# Patient Record
Sex: Female | Born: 1960 | Race: Black or African American | Hispanic: No | Marital: Married | Smoking: Never smoker
Health system: Southern US, Community
[De-identification: ages and names within clinical notes are randomized; demographics above are authoritative.]

## PROBLEM LIST (undated history)

## (undated) DIAGNOSIS — T7840XA Allergy, unspecified, initial encounter: Secondary | ICD-10-CM

## (undated) HISTORY — DX: Allergy, unspecified, initial encounter: T78.40XA

## (undated) HISTORY — PX: TUBAL LIGATION: SHX77

---

## 1998-11-12 ENCOUNTER — Other Ambulatory Visit: Admission: RE | Admit: 1998-11-12 | Discharge: 1998-11-12 | Payer: Self-pay | Admitting: Obstetrics and Gynecology

## 2001-02-20 ENCOUNTER — Other Ambulatory Visit: Admission: RE | Admit: 2001-02-20 | Discharge: 2001-02-20 | Payer: Self-pay | Admitting: Obstetrics and Gynecology

## 2002-06-04 ENCOUNTER — Other Ambulatory Visit: Admission: RE | Admit: 2002-06-04 | Discharge: 2002-06-04 | Payer: Self-pay | Admitting: Obstetrics and Gynecology

## 2003-06-10 ENCOUNTER — Other Ambulatory Visit: Admission: RE | Admit: 2003-06-10 | Discharge: 2003-06-10 | Payer: Self-pay | Admitting: Obstetrics and Gynecology

## 2004-05-04 ENCOUNTER — Ambulatory Visit (HOSPITAL_COMMUNITY): Admission: RE | Admit: 2004-05-04 | Discharge: 2004-05-04 | Payer: Self-pay | Admitting: Family Medicine

## 2004-05-11 ENCOUNTER — Encounter: Admission: RE | Admit: 2004-05-11 | Discharge: 2004-05-11 | Payer: Self-pay | Admitting: Family Medicine

## 2005-06-02 ENCOUNTER — Encounter: Admission: RE | Admit: 2005-06-02 | Discharge: 2005-06-02 | Payer: Self-pay | Admitting: Obstetrics and Gynecology

## 2005-06-11 ENCOUNTER — Encounter: Admission: RE | Admit: 2005-06-11 | Discharge: 2005-06-11 | Payer: Self-pay | Admitting: Interventional Radiology

## 2005-07-18 ENCOUNTER — Ambulatory Visit (HOSPITAL_COMMUNITY): Admission: RE | Admit: 2005-07-18 | Discharge: 2005-07-18 | Payer: Self-pay | Admitting: Interventional Radiology

## 2005-07-21 ENCOUNTER — Observation Stay (HOSPITAL_COMMUNITY): Admission: RE | Admit: 2005-07-21 | Discharge: 2005-07-22 | Payer: Self-pay | Admitting: Interventional Radiology

## 2005-08-02 ENCOUNTER — Encounter: Admission: RE | Admit: 2005-08-02 | Discharge: 2005-08-02 | Payer: Self-pay | Admitting: Diagnostic Radiology

## 2006-01-19 ENCOUNTER — Encounter: Admission: RE | Admit: 2006-01-19 | Discharge: 2006-01-19 | Payer: Self-pay | Admitting: Interventional Radiology

## 2007-06-16 ENCOUNTER — Emergency Department (HOSPITAL_COMMUNITY): Admission: EM | Admit: 2007-06-16 | Discharge: 2007-06-16 | Payer: Self-pay | Admitting: Emergency Medicine

## 2009-03-25 ENCOUNTER — Encounter: Admission: RE | Admit: 2009-03-25 | Discharge: 2009-03-25 | Payer: Self-pay | Admitting: Sports Medicine

## 2011-10-12 LAB — URINALYSIS, ROUTINE W REFLEX MICROSCOPIC
Bilirubin Urine: NEGATIVE
Glucose, UA: NEGATIVE
Ketones, ur: NEGATIVE
Nitrite: NEGATIVE
Protein, ur: NEGATIVE
Specific Gravity, Urine: 1.002 — ABNORMAL LOW
Urobilinogen, UA: 0.2
pH: 7

## 2011-10-12 LAB — URINE MICROSCOPIC-ADD ON

## 2013-03-27 ENCOUNTER — Other Ambulatory Visit (HOSPITAL_COMMUNITY)
Admission: RE | Admit: 2013-03-27 | Discharge: 2013-03-27 | Disposition: A | Discharge status: Home / Self Care | Payer: Self-pay | Source: Ambulatory Visit | Attending: Medical | Admitting: Medical

## 2013-03-27 ENCOUNTER — Encounter: Payer: Self-pay | Admitting: Medical

## 2013-03-27 ENCOUNTER — Ambulatory Visit (INDEPENDENT_AMBULATORY_CARE_PROVIDER_SITE_OTHER): Payer: Self-pay | Admitting: Medical

## 2013-03-27 VITALS — BP 119/78 | HR 79 | Ht 70.0 in | Wt 222.6 lb

## 2013-03-27 DIAGNOSIS — K5904 Chronic idiopathic constipation: Secondary | ICD-10-CM

## 2013-03-27 DIAGNOSIS — N925 Other specified irregular menstruation: Secondary | ICD-10-CM | POA: Insufficient documentation

## 2013-03-27 DIAGNOSIS — N949 Unspecified condition associated with female genital organs and menstrual cycle: Secondary | ICD-10-CM

## 2013-03-27 DIAGNOSIS — N938 Other specified abnormal uterine and vaginal bleeding: Secondary | ICD-10-CM | POA: Insufficient documentation

## 2013-03-27 DIAGNOSIS — Z01812 Encounter for preprocedural laboratory examination: Secondary | ICD-10-CM

## 2013-03-27 DIAGNOSIS — K5909 Other constipation: Secondary | ICD-10-CM

## 2013-03-27 LAB — CBC
HCT: 32.2 % — ABNORMAL LOW (ref 36.0–46.0)
Hemoglobin: 10.5 g/dL — ABNORMAL LOW (ref 12.0–15.0)
MCH: 27.9 pg (ref 26.0–34.0)
MCHC: 32.6 g/dL (ref 30.0–36.0)
MCV: 85.4 fL (ref 78.0–100.0)
Platelets: 324 10*3/uL (ref 150–400)
RBC: 3.77 MIL/uL — ABNORMAL LOW (ref 3.87–5.11)
RDW: 14.3 % (ref 11.5–15.5)
WBC: 8.1 10*3/uL (ref 4.0–10.5)

## 2013-03-27 LAB — POCT PREGNANCY, URINE: Preg Test, Ur: NEGATIVE

## 2013-03-27 MED ORDER — MEGESTROL ACETATE 40 MG PO TABS: 40.0000 mg | ORAL_TABLET | Freq: Every day | ORAL | Status: AC

## 2013-03-27 NOTE — Patient Instructions (Signed)
Dysfunctional Uterine Bleeding  Normally, menstrual periods begin between ages 11 to 17 in young women. A normal menstrual cycle/period may begin every 23 days up to 35 days and lasts from 1 to 7 days. Around 12 to 14 days before your menstrual period starts, ovulation (ovary produces an egg) occurs. When counting the time between menstrual periods, count from the first day of bleeding of the previous period to the first day of bleeding of the next period.  Dysfunctional (abnormal) uterine bleeding is bleeding that is different from a normal menstrual period. Your periods may come earlier or later than usual. They may be lighter, have blood clots or be heavier. You may have bleeding between periods, or you may skip one period or more. You may have bleeding after sexual intercourse, bleeding after menopause, or no menstrual period.  CAUSES   · Pregnancy (normal, miscarriage, tubal).  · IUDs (intrauterine device, birth control).  · Birth control pills.  · Hormone treatment.  · Menopause.  · Infection of the cervix.  · Blood clotting problems.  · Infection of the inside lining of the uterus.  · Endometriosis, inside lining of the uterus growing in the pelvis and other female organs.  · Adhesions (scar tissue) inside the uterus.  · Obesity or severe weight loss.  · Uterine polyps inside the uterus.  · Cancer of the vagina, cervix, or uterus.  · Ovarian cysts or polycystic ovary syndrome.  · Medical problems (diabetes, thyroid disease).  · Uterine fibroids (noncancerous tumor).  · Problems with your female hormones.  · Endometrial hyperplasia, very thick lining and enlarged cells inside of the uterus.  · Medicines that interfere with ovulation.  · Radiation to the pelvis or abdomen.  · Chemotherapy.  DIAGNOSIS   · Your doctor will discuss the history of your menstrual periods, medicines you are taking, changes in your weight, stress in your life, and any medical problems you may have.  · Your doctor will do a physical  and pelvic examination.  · Your doctor may want to perform certain tests to make a diagnosis, such as:  · Pap test.  · Blood tests.  · Cultures for infection.  · CT scan.  · Ultrasound.  · Hysteroscopy.  · Laparoscopy.  · MRI.  · Hysterosalpingography.  · D and C.  · Endometrial biopsy.  TREATMENT   Treatment will depend on the cause of the dysfunctional uterine bleeding (DUB). Treatment may include:  · Observing your menstrual periods for a couple of months.  · Prescribing medicines for medical problems, including:  · Antibiotics.  · Hormones.  · Birth control pills.  · Removing an IUD (intrauterine device, birth control).  · Surgery:  · D and C (scrape and remove tissue from inside the uterus).  · Laparoscopy (examine inside the abdomen with a lighted tube).  · Uterine ablation (destroy lining of the uterus with electrical current, laser, heat, or freezing).  · Hysteroscopy (examine cervix and uterus with a lighted tube).  · Hysterectomy (remove the uterus).  HOME CARE INSTRUCTIONS   · If medicines were prescribed, take exactly as directed. Do not change or switch medicines without consulting your caregiver.  · Long term heavy bleeding may result in iron deficiency. Your caregiver may have prescribed iron pills. They help replace the iron that your body lost from heavy bleeding. Take exactly as directed.  · Do not take aspirin or medicines that contain aspirin one week before or during your menstrual period. Aspirin may make   the bleeding worse.  · If you need to change your sanitary pad or tampon more than once every 2 hours, stay in bed with your feet elevated and a cold pack on your lower abdomen. Rest as much as possible, until the bleeding stops or slows down.  · Eat well-balanced meals. Eat foods high in iron. Examples are:  · Leafy green vegetables.  · Whole-grain breads and cereals.  · Eggs.  · Meat.  · Liver.  · Do not try to lose weight until the abnormal bleeding has stopped and your blood iron level is  back to normal. Do not lift more than ten pounds or do strenuous activities when you are bleeding.  · For a couple of months, make note on your calendar, marking the start and ending of your period, and the type of bleeding (light, medium, heavy, spotting, clots or missed periods). This is for your caregiver to better evaluate your problem.  SEEK MEDICAL CARE IF:   · You develop nausea (feeling sick to your stomach) and vomiting, dizziness, or diarrhea while you are taking your medicine.  · You are getting lightheaded or weak.  · You have any problems that may be related to the medicine you are taking.  · You develop pain with your DUB.  · You want to remove your IUD.  · You want to stop or change your birth control pills or hormones.  · You have any type of abnormal bleeding mentioned above.  · You are over 16 years old and have not had a menstrual period yet.  · You are 55 years old and you are still having menstrual periods.  · You have any of the symptoms mentioned above.  · You develop a rash.  SEEK IMMEDIATE MEDICAL CARE IF:   · An oral temperature above 102° F (38.9° C) develops.  · You develop chills.  · You are changing your sanitary pad or tampon more than once an hour.  · You develop abdominal pain.  · You pass out or faint.  Document Released: 12/09/2000 Document Revised: 03/05/2012 Document Reviewed: 11/10/2009  [employer]® Patient Information ©2013 [employer].

## 2013-03-27 NOTE — Progress Notes (Signed)
Patient ID: Wanda Harding, female   DOB: [birth year], 52 y.o.   MRN: [id number]  Ms. [name] is a 52 y.o. [phone] who present to clinic today because of dysfunctional uterine bleeding. The patient states that prior to 3 months ago she had regular periods. She has not been using any form of birth control recently. She states that she bled x1 month in February and ~ 3 weeks in March. She has had some fatigue and dizziness, but denies weakness or lightheadedness. The patient has also had some lower back discomfort and feeling of pressure or fullness in the pelvic area since the abnormal bleeding started. She denies recent weight loss, night sweats or malaise.   BP 119/78  Pulse 79  Ht 5\' 10"  (1.778 m)  Wt 222 lb 9.6 oz (100.971 kg)  BMI 31.94 kg/m2  LMP 03/02/2013 GENERAL: Well-developed, well-nourished female in no acute distress.  HEENT: Normocephalic, atraumatic. Sclerae anicteric.  LUNGS: Clear to auscultation bilaterally.  HEART: Regular rate and rhythm. ABDOMEN: Soft, nontender, nondistended. No organomegaly. Some more firm areas of palpation in the suprapubic region.  PELVIC: Normal external female genitalia. Vagina is pink and rugated.  Normal discharge. Normal cervix contour. EXTREMITIES: No cyanosis, clubbing, or edema, 2+ distal pulses.  ENDOMETRIAL BIOPSY     The indications for endometrial biopsy were reviewed.   Risks of the biopsy including cramping, bleeding, infection, uterine perforation, inadequate specimen and need for additional procedures  were discussed. The patient states she understands and agrees to undergo procedure today. Consent was signed. Time out was performed. Urine HCG was negative. A sterile speculum was placed in the patient's vagina and the cervix was prepped with Betadine. A single-toothed tenaculum was placed on the anterior lip of the cervix to stabilize it. The 3 mm pipelle was introduced into the endometrial cavity without difficulty to a depth of 11 cm,  and a moderate amount of tissue was obtained and sent to pathology. The instruments were removed from the patient's vagina. Minimal bleeding from the cervix was noted. The patient tolerated the procedure well. Routine post-procedure instructions were given to the patient.    Biopsy performed by Dr. Erin Fulling  A: DUB  P: CBC and Endometrial biopsy today Patient will be scheduled for [place] prior to next visit to evaluate possible causes of DUB Patient will return to clinic in 2 weeks for results and management  Freddi Starr, PA-C 03/27/2013 3:00 PM

## 2013-03-28 ENCOUNTER — Other Ambulatory Visit: Payer: Self-pay | Admitting: Medical

## 2013-03-28 ENCOUNTER — Telehealth: Payer: Self-pay | Admitting: General Practice

## 2013-03-28 DIAGNOSIS — D649 Anemia, unspecified: Secondary | ICD-10-CM

## 2013-03-28 MED ORDER — FERROUS SULFATE 325 (65 FE) MG PO TABS: 325.0000 mg | ORAL_TABLET | Freq: Every day | ORAL | Status: AC

## 2013-03-28 NOTE — Telephone Encounter (Signed)
Message copied by Kathee Delton on Thu Mar 28, 2013  2:05 PM ------      Message from: Freddi Starr      Created: Thu Mar 28, 2013  1:17 PM       Patient's Hgb is a little low from the bleeding that she has been having. I have sent Rx for iron supplements to her pharmacy. Please advise patient to pick them up at her convenience. Thanks! ------

## 2013-03-28 NOTE — Telephone Encounter (Signed)
Called patient and informed her of results/recommendations and that iron is available for her to pick up at [facility] on [address]. Patient verbalized understanding and had no further questions

## 2013-04-03 ENCOUNTER — Ambulatory Visit (HOSPITAL_COMMUNITY)
Admission: RE | Admit: 2013-04-03 | Discharge: 2013-04-03 | Disposition: A | Discharge status: Home / Self Care | Payer: Self-pay | Source: Ambulatory Visit | Attending: Medical | Admitting: Medical

## 2013-04-03 DIAGNOSIS — N938 Other specified abnormal uterine and vaginal bleeding: Secondary | ICD-10-CM | POA: Insufficient documentation

## 2013-04-03 DIAGNOSIS — N925 Other specified irregular menstruation: Secondary | ICD-10-CM | POA: Insufficient documentation

## 2013-04-03 DIAGNOSIS — N949 Unspecified condition associated with female genital organs and menstrual cycle: Secondary | ICD-10-CM | POA: Insufficient documentation

## 2013-04-03 DIAGNOSIS — D251 Intramural leiomyoma of uterus: Secondary | ICD-10-CM | POA: Insufficient documentation

## 2013-04-12 ENCOUNTER — Ambulatory Visit: Payer: Self-pay | Admitting: Medical

## 2013-04-15 ENCOUNTER — Telehealth: Payer: Self-pay

## 2013-04-15 NOTE — Telephone Encounter (Signed)
Pt called and stated that she wanted April 2nd test results.   Called pt and informed pt of her normal results.  Pt stated understanding and did not have any further questions.

## 2013-04-25 ENCOUNTER — Ambulatory Visit: Payer: Self-pay | Admitting: Obstetrics & Gynecology

## 2013-05-01 ENCOUNTER — Ambulatory Visit: Payer: Self-pay | Admitting: Obstetrics & Gynecology

## 2013-05-17 ENCOUNTER — Telehealth: Payer: Self-pay | Admitting: *Deleted

## 2013-05-17 NOTE — Telephone Encounter (Signed)
Patient left message requesting results and followup information. I called her back and she said she already knew the results and had canceled her followup appt. However she wanted to know what she needed to do now. I told her that the reason for the appt she had cancelled was to determine what to do next for her bleeding. Patient stated that she would call back to reschedule the appt.

## 2013-06-03 ENCOUNTER — Ambulatory Visit (INDEPENDENT_AMBULATORY_CARE_PROVIDER_SITE_OTHER): Payer: Self-pay | Admitting: Medical

## 2013-06-03 ENCOUNTER — Encounter: Payer: Self-pay | Admitting: Medical

## 2013-06-03 VITALS — BP 125/82 | HR 73 | Temp 97.8°F | Ht 70.0 in | Wt 227.6 lb

## 2013-06-03 DIAGNOSIS — N949 Unspecified condition associated with female genital organs and menstrual cycle: Secondary | ICD-10-CM

## 2013-06-03 DIAGNOSIS — N938 Other specified abnormal uterine and vaginal bleeding: Secondary | ICD-10-CM

## 2013-06-03 DIAGNOSIS — N925 Other specified irregular menstruation: Secondary | ICD-10-CM

## 2013-06-03 NOTE — Patient Instructions (Addendum)

## 2013-06-03 NOTE — Progress Notes (Signed)
Patient ID: Wanda Harding, female   DOB: [birth year], 52 y.o.   MRN: [id number]  History:  Wanda Harding is a 52 y.o. [id number] who presents to clinic today for follow-up for DUB. The patient states that she had no period in April and a somewhat prolonged period in May, although this consisted of a few days of spotting as well. She has taken the Iron supplement x 2-3 weeks, but stopped and restarted her Women's MV instead. She denies symptoms of anemia including, weakness, fatigue or dizziness. She states that the provera did help stop her bleeding within 4-5 days after last visit. She wants to discuss options for bleeding control today. She is not interested in the IUD, Depo provera or Provera PO at this time. She does not feel that she would like another ablation in the future either. She has declined a repeat CBC today.    The following portions of the patient's history were reviewed and updated as appropriate: allergies, current medications, past family history, past medical history, past social history, past surgical history and problem list.  Review of Systems:  Pertinent items are noted in HPI.  Objective:  Physical Exam BP 125/82  Pulse 73  Temp(Src) 97.8 F (36.6 C) (Oral)  Ht 5\' 10"  (1.778 m)  Wt 227 lb 9.6 oz (103.239 kg)  BMI 32.66 kg/m2  LMP 05/13/2013 GENERAL: Well-developed, well-nourished female in no acute distress.  HEENT: Normocephalic, atraumatic.  LUNGS: Normal effort HEART: Regular rate  SKIN: Warm, dry and without erythema PSYCH: Normal mood and affect  Labs and Imaging US Pelvis Complete  04/03/2013   *RADIOLOGY REPORT*  Clinical Data: Dysfunctional uterine bleeding.  History of uterine embolization.  LMP 03/02/2013.  TRANSABDOMINAL AND TRANSVAGINAL ULTRASOUND OF PELVIS Technique:  Both transabdominal and transvaginal ultrasound examinations of the pelvis were performed. Transabdominal technique was performed for global imaging of the pelvis including uterus,  ovaries, adnexal regions, and pelvic cul-de-sac.  It was necessary to proceed with endovaginal exam following the transabdominal exam to visualize the endometrium, uterus, and ovaries.  Comparison:  None  Findings:  Uterus: The uterus is anteverted and measures 9.6 x 6.6 x 7.0 cm. In the left lower uterine segment is a focal hypoechoic area measuring 1.1 x 0.6 x 1.2 cm, suggestive of an intramural fibroid.  Endometrium: The endometrium is thickened and heterogeneous, measuring approximately 3.0 cm greatest diameter. The junctional zone between the endometrium and myometrium is indistinct.  Small amount of vascularity is seen within the endometrium.  A discrete focal endometrial mass is not detected.  Right ovary:  Normal appearance/no adnexal mass.  Measures 1.7 x 1.3 x 2.6 cm.  Left ovary: Normal appearance/no adnexal mass.  Measures 4.2 x 3.0 x 2.3 cm and contains a dominant follicle.  Other findings: No free fluid  IMPRESSION:  1.  Thickened, heterogeneous endometrium measures approximately 3 cm greatest diameter. The possibility of an endometrial polyp, submucosal fibroid, endometrial hyperplasia, or endometrial carcinoma cannot be excluded. If bleeding remains unresponsive to hormonal or medical therapy, focal lesion work-up with sonohysterogram should be considered.  Endometrial biopsy should also be considered in pre-menopausal patients at high risk for endometrial carcinoma. (Ref:  Radiological Reasoning: Algorithmic Workup of Abnormal Vaginal Bleeding with Endovaginal Sonography and Sonohysterography. AJR 2008; 696:E95-28) 2.  Probable 1.2 cm intramural fibroid in the left lower uterine segment. 3.  Ovaries within normal limits.   Original Report Authenticated By: Britta Mccreedy, M.D.    Assessment & Plan:  Assessment: Peri-menopause Irregular  menses  Plans: 1. Patient would like to wait and observe bleeding patterns over the next few months before deciding about any intervention 2. Patient may call  for excessive bleeding and Rx for megace can be called in at that time 3. Patient may consider Depo Provera for bleeding control if heavy periods continue to be consistent 4. If patient wishes to return to clinic for discussion of bleeding control. Patient should see MD to discuss possible surgical interventions as she is not interested in most medical options we have discussed today  Freddi Starr, PA-C 06/03/2013 2:54 PM

## 2013-11-02 IMAGING — US US TRANSVAGINAL NON-OB
1 series · 13 of 25 positions shown · non-contrast
Comparison: None

CLINICAL DATA: Dysfunctional uterine bleeding.  History of uterine
embolization.  LMP 03/02/2013.



[Series 1: us pelvis complete · 59 acquisitions, 13 frames shown]
[im 1/59]
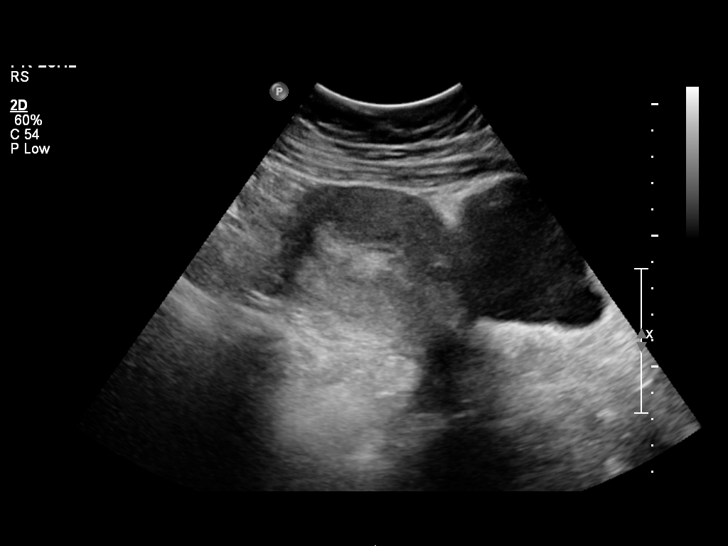
[im 5/59]
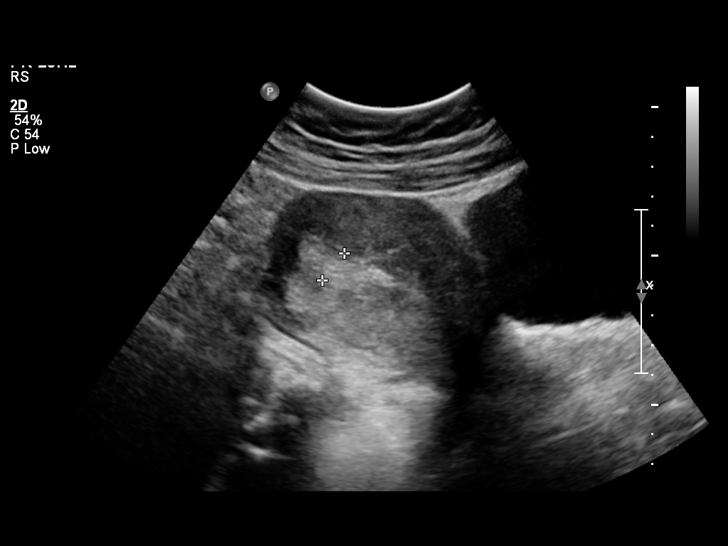
[im 10/59]
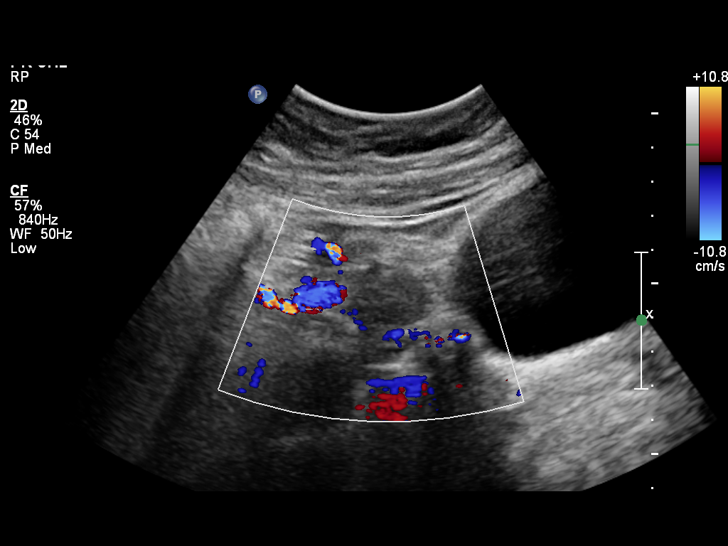
[im 15/59]
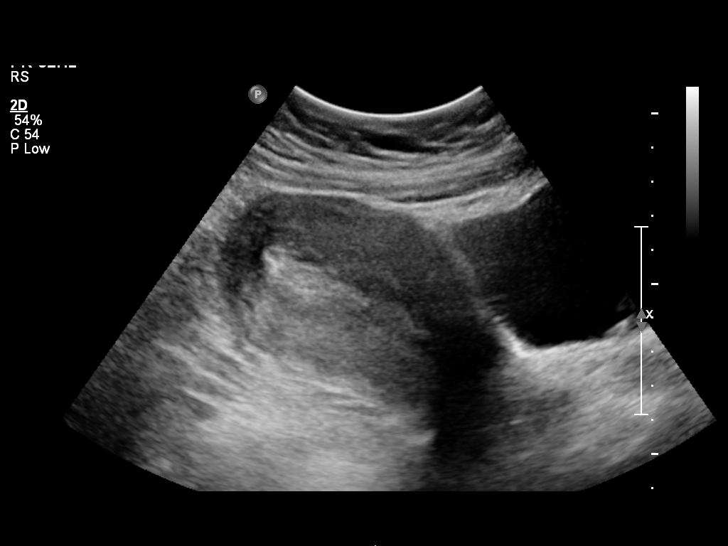
[im 20/59]
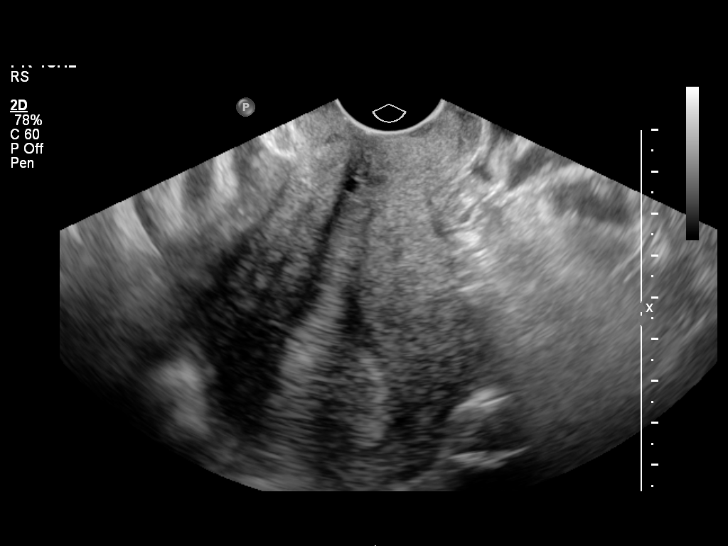
[im 25/59]
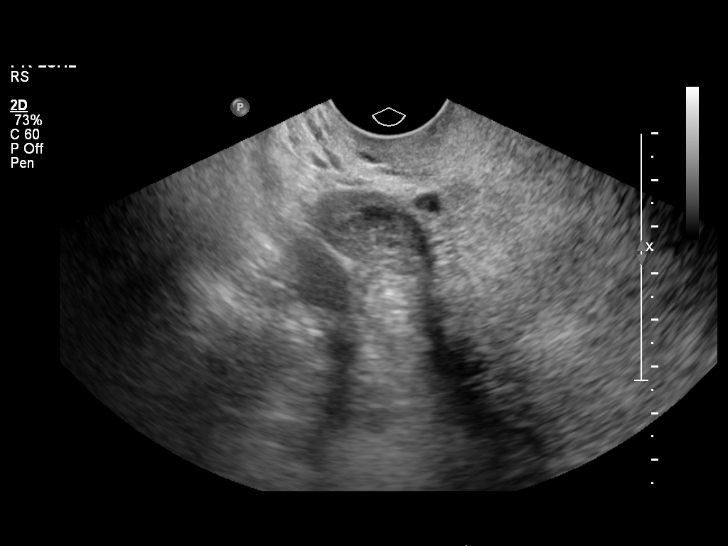
[im 30/59]
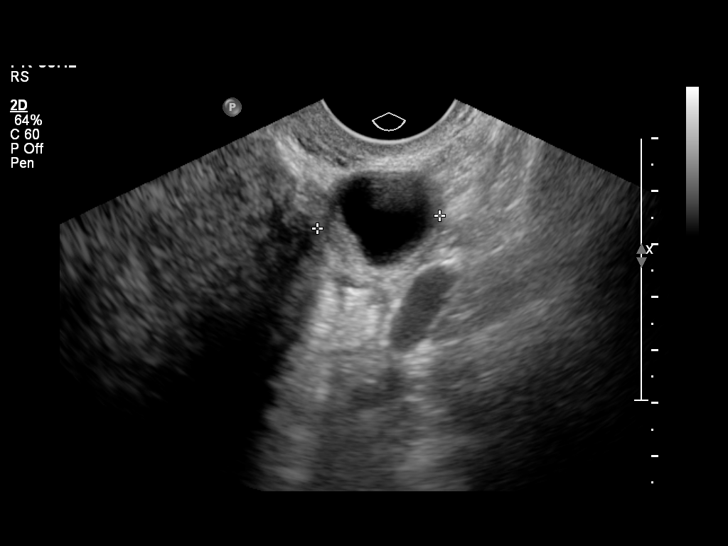
[im 34/59]
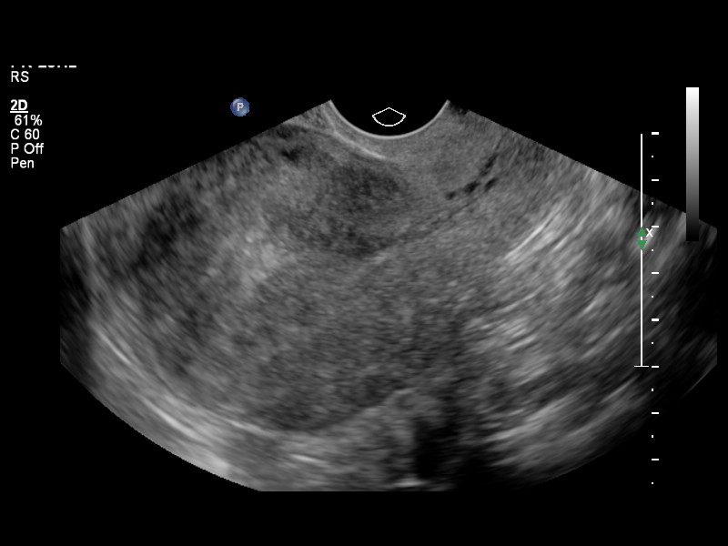
[im 39/59]
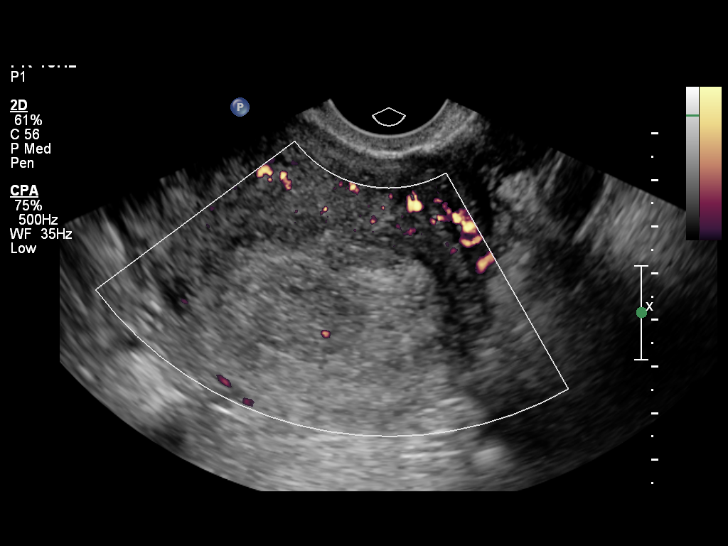
[im 44/59]
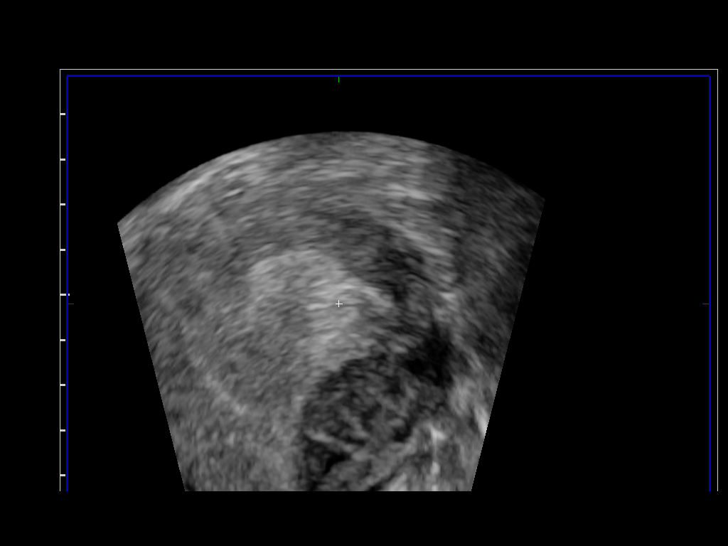
[im 49/59]
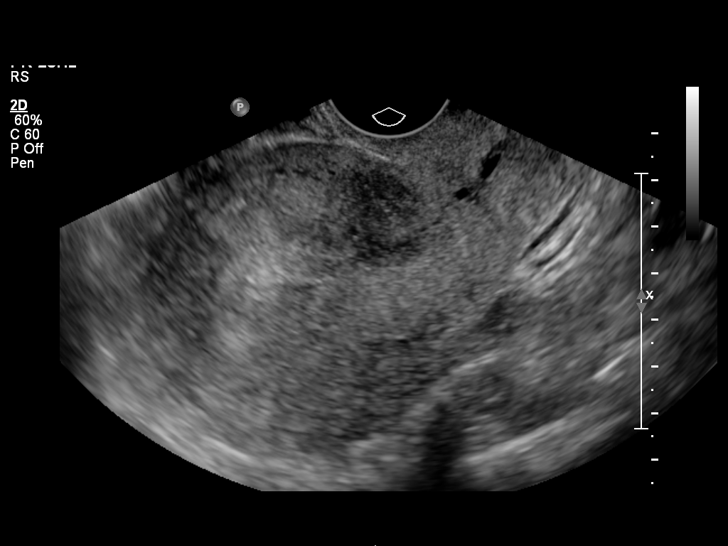
[im 54/59]
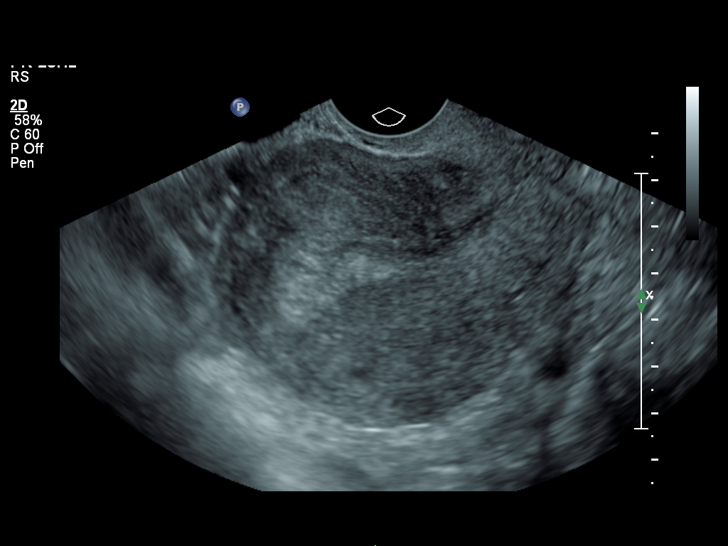
[im 59/59]
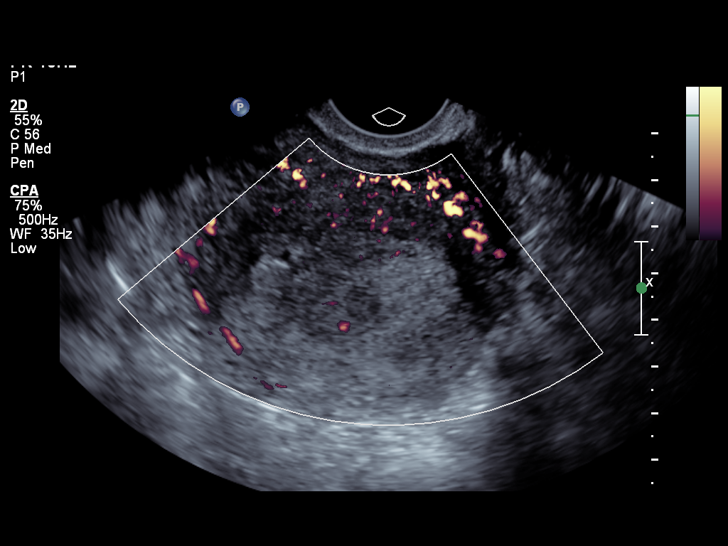

[13 of 25 positions shown; findings below may reference images not displayed]

FINDINGS: Uterus: The uterus is anteverted and measures 9.6 x 6.6 x 7.0 cm.
In the left lower uterine segment is a focal hypoechoic area
measuring 1.1 x 0.6 x 1.2 cm, suggestive of an intramural fibroid.

Endometrium: The endometrium is thickened and heterogeneous,
measuring approximately 3.0 cm greatest diameter. The junctional
zone between the endometrium and myometrium is indistinct.  Small
amount of vascularity is seen within the endometrium.  A discrete
focal endometrial mass is not detected.

Right ovary:  Normal appearance/no adnexal mass.  Measures 1.7 x
1.3 x 2.6 cm.

Left ovary: Normal appearance/no adnexal mass.  Measures 4.2 x
x 2.3 cm and contains a dominant follicle.

Other findings: No free fluid
IMPRESSION: 1.  Thickened, heterogeneous endometrium measures approximately 3
cm greatest diameter. The possibility of an endometrial polyp,
submucosal fibroid, endometrial hyperplasia, or endometrial
carcinoma cannot be excluded. If bleeding remains unresponsive to
hormonal or medical therapy, focal lesion work-up with
sonohysterogram should be considered.  Endometrial biopsy should
also be considered in pre-menopausal patients at high risk for
endometrial carcinoma. (Ref:  Radiological Reasoning: Algorithmic
Workup of Abnormal Vaginal Bleeding with Endovaginal Sonography and
Sonohysterography. AJR 0885; 191:S68-73)
2.  Probable 1.2 cm intramural fibroid in the left lower uterine
segment.
3.  Ovaries within normal limits.

## 2014-10-27 ENCOUNTER — Encounter: Payer: Self-pay | Admitting: Medical

## 2019-01-31 ENCOUNTER — Emergency Department (HOSPITAL_COMMUNITY)
Admission: EM | Admit: 2019-01-31 | Discharge: 2019-02-01 | Disposition: A | Discharge status: Home / Self Care | Payer: BC Managed Care – PPO | Attending: Emergency Medicine | Admitting: Emergency Medicine

## 2019-01-31 ENCOUNTER — Emergency Department (HOSPITAL_COMMUNITY): Payer: BC Managed Care – PPO

## 2019-01-31 ENCOUNTER — Encounter (HOSPITAL_COMMUNITY): Payer: Self-pay | Admitting: Emergency Medicine

## 2019-01-31 DIAGNOSIS — M25521 Pain in right elbow: Secondary | ICD-10-CM | POA: Diagnosis present

## 2019-01-31 DIAGNOSIS — R030 Elevated blood-pressure reading, without diagnosis of hypertension: Secondary | ICD-10-CM | POA: Diagnosis not present

## 2019-01-31 DIAGNOSIS — W19XXXA Unspecified fall, initial encounter: Secondary | ICD-10-CM

## 2019-01-31 DIAGNOSIS — W010XXA Fall on same level from slipping, tripping and stumbling without subsequent striking against object, initial encounter: Secondary | ICD-10-CM | POA: Diagnosis not present

## 2019-01-31 DIAGNOSIS — Z79899 Other long term (current) drug therapy: Secondary | ICD-10-CM | POA: Diagnosis not present

## 2019-01-31 LAB — CBG MONITORING, ED: Glucose-Capillary: 115 mg/dL — ABNORMAL HIGH (ref 70–99)

## 2019-01-31 MED ORDER — ONDANSETRON 4 MG PO TBDP: 4.0000 mg | ORAL_TABLET | Freq: Three times a day (TID) | ORAL | 0 refills | Status: AC | PRN

## 2019-01-31 MED ORDER — HYDROCODONE-ACETAMINOPHEN 5-325 MG PO TABS
2.0000 | ORAL_TABLET | Freq: Once | ORAL | Status: AC
  Administered 2019-01-31: 2 via ORAL
  Filled 2019-01-31: qty 2

## 2019-01-31 MED ORDER — ONDANSETRON 4 MG PO TBDP
4.0000 mg | ORAL_TABLET | Freq: Once | ORAL | Status: AC
  Administered 2019-01-31: 4 mg via ORAL
  Filled 2019-01-31: qty 1

## 2019-01-31 NOTE — ED Provider Notes (Signed)
[place] EMERGENCY DEPARTMENT Provider Note   CSN: [id number] Arrival date & time: 01/31/19  1727     History   Chief Complaint Chief Complaint  Patient presents with  . Fall    HPI Wanda Harding is a 58 y.o. female.  HPI  Patient is a 58 year old female with a history of dysfunctional uterine bleeding presenting for right arm injury.  Patient reports that she was in the kitchen earlier today when her socks night on a nail in the wall.  She reports that this caused her to trip and she slid across the linoleum floor and fell.  She reports that she caught her self while reaching back with her right arm and took the brunt of the fall onto her right elbow.  She reports that she currently has pain from the right humerus down to the right wrist.  Denies numbness or tingling in the arm.  Patient denies loss of consciousness or head injury.  Patient denies any preceding presyncopal symptoms of dizziness, lightheadedness, chest pain or shortness of breath.  Patient does not take blood thinning medications.  Past Medical History:  Diagnosis Date  . Allergy     Patient Active Problem List   Diagnosis Date Noted  . DUB (dysfunctional uterine bleeding) 03/27/2013    Past Surgical History:  Procedure Laterality Date  . TUBAL LIGATION       OB History    Gravida  4   Para  3   Term  3   Preterm      AB  1   Living  3     SAB      TAB      Ectopic      Multiple      Live Births               Home Medications    Prior to Admission medications   Medication Sig Start Date End Date Taking? Authorizing Provider  bisacodyl (DULCOLAX) 5 MG EC tablet Take 5 mg by mouth daily as needed for constipation.    [provider]  ferrous sulfate 325 (65 FE) MG tablet Take 1 tablet (325 mg total) by mouth daily with breakfast. 03/28/13   Luvenia Redden, PA-C  ibuprofen (ADVIL,MOTRIN) 600 MG tablet Take 600 mg by mouth every 6 (six) hours as needed  for pain.    [provider]  megestrol (MEGACE) 40 MG tablet Take 1 tablet (40 mg total) by mouth daily. 03/27/13   Luvenia Redden, PA-C  Multiple Vitamins-Iron (MULTIVITAMIN/IRON PO) Take 1 tablet by mouth daily.    [provider]  phenylephrine-shark liver oil-mineral oil-petrolatum (PREPARATION H) 0.25-3-14-71.9 % rectal ointment Place rectally 2 (two) times daily as needed for hemorrhoids.    [provider]    Family History Family History  Problem Relation Age of Onset  . Cancer Maternal Aunt        breast  . Cancer Maternal Aunt        cervical    Social History Social History   Tobacco Use  . Smoking status: Never Smoker  . Smokeless tobacco: Never Used  Substance Use Topics  . Alcohol use: Yes    Comment: rare, drinks wine 1-2 times per year  . Drug use: No     Allergies   Clindamycin/lincomycin; Darvocet [propoxyphene n-acetaminophen]; Guaifenesin & derivatives; and Sulfa antibiotics   Review of Systems Review of Systems  Musculoskeletal: Positive for arthralgias and myalgias.  Skin: Negative for wound.  Neurological: Negative for weakness and numbness.     Physical Exam Updated Vital Signs BP (!) 171/99 (BP Location: Left Arm)   Pulse 81   Temp 98.6 F (37 C) (Oral)   Resp 17   SpO2 100%   Physical Exam Vitals signs and nursing note reviewed.  Constitutional:      General: She is not in acute distress.    Appearance: She is well-developed. She is not diaphoretic.     Comments: Sitting comfortably in examination chair.   HENT:     Head: Normocephalic and atraumatic.  Eyes:     General:        Right eye: No discharge.        Left eye: No discharge.     Conjunctiva/sclera: Conjunctivae normal.     Comments: EOMs normal to gross examination.  Neck:     Musculoskeletal: Normal range of motion.  Cardiovascular:     Rate and Rhythm: Normal rate and regular rhythm.     Comments: Intact, 2+ radial pulse of right upper  extremity.  Pulmonary:     Comments: Patient converses comfortably without audible wheeze or stridor. Abdominal:     General: There is no distension.  Musculoskeletal:     Comments: Right upper extremity exam: Patient has tenderness to palpation over the right humeral head.  No tenderness palpation of the humeral shaft.  Patient is tenderness posterior aspect of elbow.  Patient has difficulty with flexion, extension, pronation and supination of the right forearm.  No tenderness to palpation of the radial or ulnar shafts.  Patient does have some mild distal radius tenderness to palpation.  No snuffbox tenderness.  Patient has less than 2-second capillary refill distally in the right upper extremity.  Full sensation to light touch distally.  All compartments of the right upper extremity are soft.  Skin:    General: Skin is warm and dry.  Neurological:     Mental Status: She is alert.     Comments: Cranial nerves intact to gross observation. Patient moves extremities without difficulty.  Psychiatric:        Behavior: Behavior normal.        Thought Content: Thought content normal.        Judgment: Judgment normal.      ED Treatments / Results  Labs (all labs ordered are listed, but only abnormal results are displayed) Labs Reviewed - No data to display  EKG None  Radiology No results found.  Procedures Procedures (including critical care time)  Medications Ordered in ED Medications  HYDROcodone-acetaminophen (NORCO/VICODIN) 5-325 MG per tablet 2 tablet (2 tablets Oral Given 01/31/19 1753)     Initial Impression / Assessment and Plan / ED Course  I have reviewed the triage vital signs and the nursing notes.  Pertinent labs & imaging results that were available during my care of the patient were reviewed by me and considered in my medical decision making (see chart for details).  Clinical Course as of Feb 01 2156  Thu Jan 31, 2019  2024 Reassessed.  Patient is having  continued pain with inability to extend the right elbow.  She reports her pain significantly improved after having Norco, however it is made her nauseous.  Discussed risk and benefits of CT including deferring further imaging for outpatient with immobilization versus obtaining CT today.  Patient elected for CT today.  Feel this is reasonable.   [AM]    Clinical Course User Index [AM]  Albesa Seen, PA-C    Patient well-appearing and neurovascularly intact.  Soft compartments, good capillary refill and no paresthesias.  Will obtain radiographs of right humerus, elbow, and wrist to assess for any injuries.  Shoulder, elbow, and wrist radiographs are demonstrating no evidence of acute fracture.  Patient is still having persistent pain in the posterior right elbow.  No effusion seen on x-ray.  No anterior posterior sail sign.  I did discuss with the patient that CT would be recommended if she still has inability to flex and extend the elbow.  Patient was amenable to CT scan, however was experiencing persistent nausea and vomiting from [place].  She reported that she felt unable to go to the CT scanner due to not wanting to change her position.  Discussed risks and benefits of immobilization follow-up with orthopedics versus symptomatic treatment and CT scan.  Patient was not having any nausea or vomiting prior to administration of Norco.  Suspect this is an iatrogenic reaction.  She did not have any presyncopal symptoms prior to her fall today, do not suspect cardiac cause of patient's symptoms.  Patient is directed to closely follow-up with orthopedics, and return if she develops any increasing pain, swelling or pallor or paresthesias of the right upper extremity.  I also recommended that her pain is improving to try to perform range of motion of the right shoulder, as to avoid frozen shoulder.  Patient is in understanding and agrees with plan of care.  Final Clinical Impressions(s) / ED Diagnoses    Final diagnoses:  Fall, initial encounter  Right elbow pain  Elevated blood-pressure reading without diagnosis of hypertension    ED Discharge Orders         Ordered    ondansetron (ZOFRAN-ODT) 4 MG disintegrating tablet  Every 8 hours PRN     01/31/19 2323           Albesa Seen, PA-C 01/31/19 2329    Dorie Rank, MD 02/02/19 [phone]

## 2019-01-31 NOTE — ED Notes (Addendum)
Pt. Refused CT

## 2019-01-31 NOTE — Discharge Instructions (Signed)
Please see the information and instructions below regarding your visit.  Your diagnoses today include:  1. Fall, initial encounter   2. Right elbow pain     Tests performed today include: See side panel of your discharge paperwork for testing performed today. Vital signs are listed at the bottom of these instructions.   The x-rays of your right upper arm, elbow, and wrist do not show acute fracture on x-ray.  Small hairline fractures can be missed however.  If your pain is not improving, you will need further imaging to rule out hairline fracture.  Medications prescribed:    Take any prescribed medications only as prescribed, and any over the counter medications only as directed on the packaging.  I recommend Tylenol, [phone] mg every 6 hours as needed for pain.  Do not exceed 4000 mg in 1 day.  Home care instructions:  Please follow any educational materials contained in this packet.   Please keep your arm in the sling.  You may take it off for bathing.  If your pain is not improving in the next couple days, this is likely a sprain of the elbow.  However, for having persistent pain, there may be a fracture underlying that we will need further imaging.  Please keep the sling on until you are evaluated by orthopedics.  Please take the sling off to perform range of motion of the right shoulder.  This will prevent any frozen shoulder as result of the injury.  Follow-up instructions:  Please follow up with Dr. Griffin Basil as soon as possible for your injury.  Return instructions:  Please return to the Emergency Department if you experience worsening symptoms.  Please return to the emergency department if you develop any increasing pain, swelling, or numbness or tingling or discoloration of the fingers of the right hand. Please return if you have any other emergent concerns.  Additional Information:   Your vital signs today were: BP (!) 156/90    Pulse 76    Temp 98.6 F (37 C) (Oral)     Resp 16    SpO2 100%  If your blood pressure (BP) was elevated on multiple readings during this visit above 130 for the top number or above 80 for the bottom number, please have this repeated by your primary care provider within one month. --------------  Thank you for allowing [place] to participate in your care today.

## 2019-01-31 NOTE — ED Notes (Signed)
Patient transported to X-ray 

## 2019-01-31 NOTE — ED Triage Notes (Signed)
Pt reports tripping and fell in her kitchen, landing on right elbow. Endorses some back pain but mainly in elbow.

## 2019-02-04 ENCOUNTER — Other Ambulatory Visit: Payer: Self-pay | Admitting: Obstetrics and Gynecology

## 2019-02-04 DIAGNOSIS — N6310 Unspecified lump in the right breast, unspecified quadrant: Secondary | ICD-10-CM

## 2019-02-08 ENCOUNTER — Ambulatory Visit
Admission: RE | Admit: 2019-02-08 | Discharge: 2019-02-08 | Disposition: A | Discharge status: Home / Self Care | Payer: BC Managed Care – PPO | Source: Ambulatory Visit | Attending: Obstetrics and Gynecology | Admitting: Obstetrics and Gynecology

## 2019-02-08 DIAGNOSIS — N6310 Unspecified lump in the right breast, unspecified quadrant: Secondary | ICD-10-CM

## 2019-08-14 ENCOUNTER — Other Ambulatory Visit: Payer: Self-pay

## 2019-08-14 DIAGNOSIS — Z20822 Contact with and (suspected) exposure to covid-19: Secondary | ICD-10-CM

## 2019-08-15 LAB — NOVEL CORONAVIRUS, NAA: SARS-CoV-2, NAA: NOT DETECTED

## 2020-03-06 ENCOUNTER — Other Ambulatory Visit: Payer: Self-pay | Admitting: Obstetrics and Gynecology

## 2020-03-06 DIAGNOSIS — N644 Mastodynia: Secondary | ICD-10-CM

## 2020-03-06 DIAGNOSIS — N6310 Unspecified lump in the right breast, unspecified quadrant: Secondary | ICD-10-CM

## 2020-03-25 ENCOUNTER — Ambulatory Visit: Payer: BC Managed Care – PPO

## 2020-03-25 ENCOUNTER — Ambulatory Visit
Admission: RE | Admit: 2020-03-25 | Discharge: 2020-03-25 | Disposition: A | Discharge status: Home / Self Care | Payer: BC Managed Care – PPO | Source: Ambulatory Visit | Attending: Obstetrics and Gynecology | Admitting: Obstetrics and Gynecology

## 2020-03-25 ENCOUNTER — Other Ambulatory Visit: Payer: Self-pay

## 2020-03-25 DIAGNOSIS — N6310 Unspecified lump in the right breast, unspecified quadrant: Secondary | ICD-10-CM

## 2020-03-25 DIAGNOSIS — N644 Mastodynia: Secondary | ICD-10-CM

## 2020-04-09 ENCOUNTER — Ambulatory Visit: Payer: BC Managed Care – PPO | Attending: Family

## 2020-04-09 DIAGNOSIS — Z23 Encounter for immunization: Secondary | ICD-10-CM

## 2020-04-09 NOTE — Progress Notes (Signed)
   Covid-19 Vaccination Clinic  Name:  Wanda Harding    MRN: [id number] DOB: 04-Jul-1961  04/09/2020  [name] was observed post Covid-19 immunization for 15 minutes without incident. She was provided with Vaccine Information Sheet and instruction to access the V-Safe system.   [name] was instructed to call 911 with any severe reactions post vaccine: Marland Kitchen Difficulty breathing  . Swelling of face and throat  . A fast heartbeat  . A bad rash all over body  . Dizziness and weakness   Immunizations Administered    Name Date Dose VIS Date Route   Moderna COVID-19 Vaccine 04/09/2020  4:00 PM 0.5 mL 11/26/2019 Intramuscular   Manufacturer: Moderna   Lot: [id number]   [place][id number]

## 2020-05-05 ENCOUNTER — Ambulatory Visit: Payer: BC Managed Care – PPO

## 2020-05-07 ENCOUNTER — Ambulatory Visit: Payer: BC Managed Care – PPO | Attending: Family

## 2020-05-07 DIAGNOSIS — Z23 Encounter for immunization: Secondary | ICD-10-CM

## 2020-05-07 NOTE — Progress Notes (Signed)
   Covid-19 Vaccination Clinic  Name:  Wanda Harding    MRN: [id number] DOB: 1961-03-03  05/07/2020  [name] was observed post Covid-19 immunization for 15 minutes without incident. She was provided with Vaccine Information Sheet and instruction to access the V-Safe system.   [name] was instructed to call 911 with any severe reactions post vaccine: Marland Kitchen Difficulty breathing  . Swelling of face and throat  . A fast heartbeat  . A bad rash all over body  . Dizziness and weakness   Immunizations Administered    Name Date Dose VIS Date Route   Moderna COVID-19 Vaccine 05/07/2020  4:17 PM 0.5 mL 11/2019 Intramuscular   Manufacturer: Moderna   Lot: [id number]   [place][id number]

## 2021-02-10 ENCOUNTER — Ambulatory Visit: Payer: BC Managed Care – PPO | Admitting: Podiatry

## 2021-02-10 ENCOUNTER — Encounter: Payer: Self-pay | Admitting: Podiatry

## 2021-02-10 ENCOUNTER — Ambulatory Visit (INDEPENDENT_AMBULATORY_CARE_PROVIDER_SITE_OTHER): Payer: BC Managed Care – PPO

## 2021-02-10 ENCOUNTER — Other Ambulatory Visit: Payer: Self-pay

## 2021-02-10 DIAGNOSIS — M79672 Pain in left foot: Secondary | ICD-10-CM

## 2021-02-10 DIAGNOSIS — I10 Essential (primary) hypertension: Secondary | ICD-10-CM | POA: Insufficient documentation

## 2021-02-10 DIAGNOSIS — M7672 Peroneal tendinitis, left leg: Secondary | ICD-10-CM

## 2021-02-10 DIAGNOSIS — E6609 Other obesity due to excess calories: Secondary | ICD-10-CM | POA: Insufficient documentation

## 2021-02-10 MED ORDER — DICLOFENAC SODIUM 75 MG PO TBEC: 75.0000 mg | DELAYED_RELEASE_TABLET | Freq: Two times a day (BID) | ORAL | 2 refills | Status: AC

## 2021-02-11 ENCOUNTER — Other Ambulatory Visit: Payer: Self-pay | Admitting: Podiatry

## 2021-02-11 DIAGNOSIS — M7672 Peroneal tendinitis, left leg: Secondary | ICD-10-CM

## 2021-02-11 NOTE — Progress Notes (Signed)
Subjective:   Patient ID: Wanda Harding, female   DOB: 60 y.o.   MRN: [id number]   HPI Patient presents with a lot of pain in the outside of her left foot does not remember specific injury but states that it is been bothering her for around 6 weeks.  Patient may have hit it but not sure and patient is active at her job moderately obese and does not smoke   Review of Systems  All other systems reviewed and are negative.       Objective:  Physical Exam Vitals and nursing note reviewed.  Constitutional:      Appearance: She is well-developed and well-nourished.  Cardiovascular:     Pulses: Intact distal pulses.  Pulmonary:     Effort: Pulmonary effort is normal.  Musculoskeletal:        General: Normal range of motion.  Skin:    General: Skin is warm.  Neurological:     Mental Status: She is alert.     Neurovascular status found to be intact muscle strength was found to be adequate range of motion adequate.  Patient has quite a bit of discomfort in the outside of the left foot at the peroneal insertion into the base of the fifth metatarsal with inflammation fluid buildup around the area.  No indication of muscle strength loss and it is moderately swollen in the area with patient having good digital perfusion well oriented x3     Assessment:  Acute peroneal tendinitis left with inflammation     Plan:  H&P conditions discussed and x-ray reviewed.  Today I did sterile prep and injected the peroneal insertion base of fifth metatarsal 3 mg dexamethasone Kenalog 5 mg Xylocaine and applied fascial brace to lift up the lateral side of the foot and encouraged active ice therapy.  Placed on diclofenac 75 mg twice daily and discussed possibility for MRI or immobilization if symptoms were to persist.  Reappoint 2 weeks  X-rays indicate there is no signs of stress fracture around the area or arthritic process occurring around this area

## 2021-02-25 ENCOUNTER — Ambulatory Visit: Payer: BC Managed Care – PPO | Admitting: Podiatry

## 2021-03-10 ENCOUNTER — Encounter: Payer: Self-pay | Admitting: Podiatry

## 2021-03-10 ENCOUNTER — Ambulatory Visit: Payer: BC Managed Care – PPO | Admitting: Podiatry

## 2021-03-10 ENCOUNTER — Other Ambulatory Visit: Payer: Self-pay

## 2021-03-10 DIAGNOSIS — M7672 Peroneal tendinitis, left leg: Secondary | ICD-10-CM

## 2021-03-10 DIAGNOSIS — B351 Tinea unguium: Secondary | ICD-10-CM | POA: Diagnosis not present

## 2021-03-13 NOTE — Progress Notes (Signed)
Subjective:   Patient ID: Wanda Harding, female   DOB: 60 y.o.   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  For the peroneal tendinitis continue ice therapy shoe gear modification brace usage and for nail bed I do not recommend current treatment as I do think that this is not fungal in origin and is related to hereditary and recommended that she could paint the nails at this time.  Patient will be seen back to recheck as needed

## 2021-04-21 ENCOUNTER — Ambulatory Visit: Payer: BC Managed Care – PPO | Admitting: Podiatry

## 2021-04-21 ENCOUNTER — Other Ambulatory Visit: Payer: Self-pay

## 2021-04-21 ENCOUNTER — Encounter: Payer: Self-pay | Admitting: Podiatry

## 2021-04-21 ENCOUNTER — Telehealth: Payer: Self-pay | Admitting: *Deleted

## 2021-04-21 DIAGNOSIS — M7672 Peroneal tendinitis, left leg: Secondary | ICD-10-CM

## 2021-04-21 MED ORDER — MELOXICAM 15 MG PO TABS: 15.0000 mg | ORAL_TABLET | Freq: Every day | ORAL | 2 refills | Status: AC

## 2021-04-21 NOTE — Telephone Encounter (Signed)
Patient is having pain and swelling in lower extremity of the left foot since 2 days and is progressively becoming worse. She has been taking ibuprofen, icing and has been out of work for 2 days. Patient is scheduled to come in @3 :45 today.

## 2021-04-23 NOTE — Progress Notes (Signed)
Subjective:   Patient ID: Wanda Harding, female   DOB: 60 y.o.   MRN: [id number]   HPI Patient states she has had a significant reoccurrence of discomfort in the left lateral foot and states its been sore or inflamed and her foot feels unstable.  Does not want further injections as it was only seemed to help for short period   ROS      Objective:  Physical Exam  Neurovascular status intact with discomfort on the lateral side of the left foot around the peroneal base with inflammation fluid at that point     Assessment:  Inflammatory tendinitis of the left lateral foot of peroneal nature that is quite sore and seems to be in response to excessive ambulation or when active     Plan:  H&P reviewed condition recommended long-term boot therapy along with ice and explained that gradual increase in activities and weaning off the boot will be done after 3 weeks.  Reappoint 4 weeks may require MRI or other treatment if symptoms persist

## 2021-05-19 ENCOUNTER — Ambulatory Visit: Payer: BC Managed Care – PPO | Admitting: Podiatry

## 2021-05-31 ENCOUNTER — Other Ambulatory Visit: Payer: Self-pay

## 2021-05-31 ENCOUNTER — Encounter: Payer: Self-pay | Admitting: Podiatry

## 2021-05-31 ENCOUNTER — Ambulatory Visit: Payer: BC Managed Care – PPO | Admitting: Podiatry

## 2021-05-31 DIAGNOSIS — T148XXA Other injury of unspecified body region, initial encounter: Secondary | ICD-10-CM

## 2021-05-31 DIAGNOSIS — M7672 Peroneal tendinitis, left leg: Secondary | ICD-10-CM | POA: Diagnosis not present

## 2021-05-31 MED ORDER — PREDNISONE 10 MG PO TABS: ORAL_TABLET | ORAL | 0 refills | Status: AC

## 2021-05-31 NOTE — Progress Notes (Signed)
Subjective:   Patient ID: Wanda Harding, female   DOB: 60 y.o.   MRN: [id number]   HPI Patient states she has had some improvement within her left outside foot but states it still bothers her at times and she is trying to reduce her boot over time   ROS      Objective:  Physical Exam  Neurovascular status intact negative Bevelyn Buckles' sign noted left peroneal insertion mildly tender localized with no exquisite swelling noted at the current time mild discoloration at the insertion     Assessment:  Appears to be continued inflammatory process with possibility that there still could be involvement of tear of the tendon     Plan:  H&P reviewed condition discussed treatment options and at this point we are can continue topical medicines boot with slow reduction and I put her on a steroid 12-day pack with instructions on usage and reduced activity.  Discussed possible MRI possible other treatment we will see response over the next 6 weeks and make a decision on what will be best

## 2021-07-12 ENCOUNTER — Ambulatory Visit: Payer: BC Managed Care – PPO | Admitting: Podiatry

## 2021-07-23 ENCOUNTER — Telehealth: Payer: Self-pay | Admitting: Podiatry

## 2021-07-23 NOTE — Telephone Encounter (Signed)
Patient calling to get scheduled for MRI. There is no referral for imaging in the chart at this time. Patient stated she was told she needed MRI at last office visit. Please advise.

## 2021-07-26 ENCOUNTER — Other Ambulatory Visit: Payer: Self-pay | Admitting: Podiatry

## 2021-07-26 DIAGNOSIS — M7672 Peroneal tendinitis, left leg: Secondary | ICD-10-CM

## 2021-07-26 DIAGNOSIS — T148XXA Other injury of unspecified body region, initial encounter: Secondary | ICD-10-CM

## 2021-07-26 NOTE — Telephone Encounter (Signed)
I ordered from [facility]. She should hear within 2 weeks

## 2021-08-02 ENCOUNTER — Other Ambulatory Visit: Payer: Self-pay

## 2021-08-02 ENCOUNTER — Ambulatory Visit
Admission: RE | Admit: 2021-08-02 | Discharge: 2021-08-02 | Disposition: A | Discharge status: Home / Self Care | Payer: BC Managed Care – PPO | Source: Ambulatory Visit | Attending: Podiatry | Admitting: Podiatry

## 2021-08-02 DIAGNOSIS — T148XXA Other injury of unspecified body region, initial encounter: Secondary | ICD-10-CM

## 2021-08-04 ENCOUNTER — Telehealth: Payer: Self-pay | Admitting: *Deleted

## 2021-08-04 NOTE — Telephone Encounter (Signed)
Patient is calling to request MRI results. Please advise.

## 2021-08-04 NOTE — Telephone Encounter (Signed)
Results did not indicate pathology. No tear with possibility for inflammation. I will need to see

## 2021-08-10 NOTE — Telephone Encounter (Signed)
Returned the call to patient, gave MRI results per Dr Paulla Dolly, said that she will call back to schedule appointment if further discussions are requested.

## 2021-10-13 ENCOUNTER — Emergency Department (HOSPITAL_COMMUNITY)
Admission: EM | Admit: 2021-10-13 | Discharge: 2021-10-13 | Disposition: A | Discharge status: Home / Self Care | Payer: BC Managed Care – PPO | Attending: Emergency Medicine | Admitting: Emergency Medicine

## 2021-10-13 DIAGNOSIS — H53149 Visual discomfort, unspecified: Secondary | ICD-10-CM | POA: Insufficient documentation

## 2021-10-13 DIAGNOSIS — R112 Nausea with vomiting, unspecified: Secondary | ICD-10-CM | POA: Insufficient documentation

## 2021-10-13 DIAGNOSIS — R52 Pain, unspecified: Secondary | ICD-10-CM

## 2021-10-13 DIAGNOSIS — M791 Myalgia, unspecified site: Secondary | ICD-10-CM | POA: Insufficient documentation

## 2021-10-13 DIAGNOSIS — R519 Headache, unspecified: Secondary | ICD-10-CM | POA: Diagnosis present

## 2021-10-13 DIAGNOSIS — Z20822 Contact with and (suspected) exposure to covid-19: Secondary | ICD-10-CM | POA: Diagnosis not present

## 2021-10-13 LAB — RESP PANEL BY RT-PCR (FLU A&B, COVID) ARPGX2
Influenza A by PCR: NEGATIVE
Influenza B by PCR: NEGATIVE
SARS Coronavirus 2 by RT PCR: NEGATIVE

## 2021-10-13 MED ORDER — DIPHENHYDRAMINE HCL 25 MG PO CAPS
25.0000 mg | ORAL_CAPSULE | Freq: Once | ORAL | Status: AC
  Administered 2021-10-13: 25 mg via ORAL
  Filled 2021-10-13: qty 1

## 2021-10-13 MED ORDER — CYCLOBENZAPRINE HCL 10 MG PO TABS
10.0000 mg | ORAL_TABLET | Freq: Once | ORAL | Status: AC
  Administered 2021-10-13: 10 mg via ORAL
  Filled 2021-10-13: qty 1

## 2021-10-13 MED ORDER — ONDANSETRON HCL 4 MG/2ML IJ SOLN
4.0000 mg | Freq: Once | INTRAMUSCULAR | Status: DC
  Filled 2021-10-13: qty 2

## 2021-10-13 MED ORDER — CYCLOBENZAPRINE HCL 10 MG PO TABS: 10.0000 mg | ORAL_TABLET | Freq: Two times a day (BID) | ORAL | 0 refills | Status: AC | PRN

## 2021-10-13 MED ORDER — PROCHLORPERAZINE MALEATE 5 MG PO TABS
10.0000 mg | ORAL_TABLET | Freq: Once | ORAL | Status: AC
  Administered 2021-10-13: 10 mg via ORAL
  Filled 2021-10-13: qty 2

## 2021-10-13 MED ORDER — DIPHENHYDRAMINE HCL 50 MG/ML IJ SOLN
12.5000 mg | Freq: Once | INTRAMUSCULAR | Status: DC
Start: 2021-10-13 — End: 2021-10-13
  Filled 2021-10-13: qty 1

## 2021-10-13 MED ORDER — SODIUM CHLORIDE 0.9 % IV BOLUS: 500.0000 mL | Freq: Once | INTRAVENOUS | Status: DC

## 2021-10-13 MED ORDER — ONDANSETRON 8 MG PO TBDP
8.0000 mg | ORAL_TABLET | Freq: Once | ORAL | Status: AC
  Administered 2021-10-13: 8 mg via ORAL
  Filled 2021-10-13: qty 1

## 2021-10-13 MED ORDER — METOCLOPRAMIDE HCL 5 MG/ML IJ SOLN
10.0000 mg | Freq: Once | INTRAMUSCULAR | Status: DC
  Filled 2021-10-13: qty 2

## 2021-10-13 MED ORDER — ONDANSETRON HCL 4 MG PO TABS: 4.0000 mg | ORAL_TABLET | Freq: Three times a day (TID) | ORAL | 0 refills | Status: AC | PRN

## 2021-10-13 NOTE — ED Provider Notes (Signed)
[facility] EMERGENCY DEPARTMENT Provider Note   CSN: [id number] Arrival date & time: 10/13/21  1153     History Chief Complaint  Patient presents with   Migraine    Wanda Harding is a 60 y.o. female.  HPI  Patient with no significant medical history presents to the emergency department with chief complaint of a headache and nausea and vomiting.  Patient states this started today, states that the headache began this morning, came on gradually has gotten worse, states headache is mainly in the front and on top of her head, has associated photophobia, increased sensitivity to noise, denies change in vision, paresthesias or weakness of the upper lower extremities, she denies neck pain, associated fevers, chills, chest pain, shortness of breath, peripheral edema, no history of IV drug use, she denies recent falls, is not on anticoagulant.  She states that she has had headaches like this in the past, not the worst headache of her life.  She states that she is taking some ibuprofen without much relief.  Patient has no other complaints this time, she does not endorse fevers, chills, chest pain, shortness of breath, worsening pedal edema.  Past Medical History:  Diagnosis Date   Allergy     Patient Active Problem List   Diagnosis Date Noted   Benign essential hypertension 02/10/2021   Obesity due to excess calories 02/10/2021   DUB (dysfunctional uterine bleeding) 03/27/2013    Past Surgical History:  Procedure Laterality Date   TUBAL LIGATION       OB History     Gravida  4   Para  3   Term  3   Preterm      AB  1   Living  3      SAB      IAB      Ectopic      Multiple      Live Births              Family History  Problem Relation Age of Onset   Cancer Maternal Aunt        breast   Breast cancer Maternal Aunt    Cancer Maternal Aunt        cervical   Breast cancer Sister     Social History   Tobacco Use   Smoking status: Never   Smokeless  tobacco: Never  Substance Use Topics   Alcohol use: Yes    Comment: rare, drinks wine 1-2 times per year   Drug use: No    Home Medications Prior to Admission medications   Medication Sig Start Date End Date Taking? Authorizing Provider  cyclobenzaprine (FLEXERIL) 10 MG tablet Take 1 tablet (10 mg total) by mouth 2 (two) times daily as needed for muscle spasms. 10/13/21  Yes Marcello Fennel, PA-C  hydrocortisone cream 1 % Apply 1 application topically 2 (two) times daily.   Yes [provider]  ibuprofen (ADVIL,MOTRIN) 600 MG tablet Take 600 mg by mouth every 6 (six) hours as needed for pain.   Yes [provider]  ondansetron (ZOFRAN) 4 MG tablet Take 1 tablet (4 mg total) by mouth every 8 (eight) hours as needed for nausea or vomiting. 10/13/21  Yes Marcello Fennel, PA-C  bisacodyl (DULCOLAX) 5 MG EC tablet Take 5 mg by mouth daily as needed for constipation. Patient not taking: No sig reported    [provider]  diclofenac (VOLTAREN) 75 MG EC tablet Take 1 tablet (75 mg  total) by mouth 2 (two) times daily. Patient not taking: No sig reported 02/10/21   Wallene Huh, DPM  ferrous sulfate 325 (65 FE) MG tablet Take 1 tablet (325 mg total) by mouth daily with breakfast. Patient not taking: No sig reported 03/28/13   Luvenia Redden, PA-C  LORazepam (ATIVAN) 0.5 MG tablet Take 0.5 mg by mouth 2 (two) times daily as needed. Patient not taking: Reported on 10/13/2021 09/03/21   [provider]  megestrol (MEGACE) 40 MG tablet Take 1 tablet (40 mg total) by mouth daily. Patient not taking: No sig reported 03/27/13   Luvenia Redden, PA-C  meloxicam (MOBIC) 15 MG tablet Take 1 tablet (15 mg total) by mouth daily. Patient not taking: No sig reported 04/21/21   Wallene Huh, DPM  Multiple Vitamins-Iron (MULTIVITAMIN/IRON PO) Take 1 tablet by mouth daily. Patient not taking: No sig reported    [provider]  ondansetron (ZOFRAN-ODT) 4 MG  disintegrating tablet Take 1 tablet (4 mg total) by mouth every 8 (eight) hours as needed for nausea or vomiting. Patient not taking: No sig reported 01/31/19   Langston Masker B, PA-C  phenylephrine-shark liver oil-mineral oil-petrolatum (PREPARATION H) 0.25-3-14-71.9 % rectal ointment Place rectally 2 (two) times daily as needed for hemorrhoids. Patient not taking: No sig reported    [provider]  predniSONE (DELTASONE) 10 MG tablet 12 day tapering dose Patient not taking: No sig reported 05/31/21   Wallene Huh, DPM    Allergies    Aspirin, Clindamycin/lincomycin, Darvocet [propoxyphene n-acetaminophen], Guaifenesin & derivatives, Sulfa antibiotics, and Hydrocodone  Review of Systems   Review of Systems  Constitutional:  Negative for chills and fever.  HENT:  Negative for congestion.   Eyes:  Positive for photophobia.  Respiratory:  Negative for shortness of breath.   Cardiovascular:  Negative for chest pain.  Gastrointestinal:  Positive for nausea and vomiting. Negative for abdominal pain.  Genitourinary:  Negative for enuresis.  Musculoskeletal:  Negative for back pain.  Skin:  Negative for rash.  Neurological:  Positive for headaches. Negative for dizziness.  Hematological:  Does not bruise/bleed easily.   Physical Exam Updated Vital Signs BP (!) 154/84   Pulse 66   Temp 97.9 F (36.6 C) (Oral)   Resp 15   Ht 5\' 10"  (1.778 m)   Wt 116.1 kg   SpO2 97%   BMI 36.73 kg/m   Physical Exam Vitals and nursing note reviewed.  Constitutional:      General: She is not in acute distress.    Appearance: She is not ill-appearing.  HENT:     Head: Normocephalic and atraumatic.     Nose: No congestion.     Mouth/Throat:     Mouth: Mucous membranes are moist.     Pharynx: Oropharynx is clear.  Eyes:     Conjunctiva/sclera: Conjunctivae normal.  Cardiovascular:     Rate and Rhythm: Normal rate and regular rhythm.     Pulses: Normal pulses.     Heart sounds: No  murmur heard.   No friction rub. No gallop.  Pulmonary:     Effort: No respiratory distress.     Breath sounds: No wheezing, rhonchi or rales.  Musculoskeletal:     Right lower leg: No edema.     Left lower leg: No edema.     Comments: Patient has full range of motion, 5 of 5 strength, neuro vas intact the upper lower extremities, spine was palpated was nontender to  palpation.  Skin:    General: Skin is warm and dry.  Neurological:     Mental Status: She is alert.     GCS: GCS eye subscore is 4. GCS verbal subscore is 5. GCS motor subscore is 6.     Cranial Nerves: No cranial nerve deficit or facial asymmetry.     Sensory: Sensation is intact.     Motor: No weakness.     Coordination: Romberg sign negative. Finger-Nose-Finger Test normal.     Comments: Cranial nerves II through XII are grossly intact, no difficult word finding, no slurring of words, able to follow two-step commands, no unilateral weakness present.  Psychiatric:        Mood and Affect: Mood normal.    ED Results / Procedures / Treatments   Labs (all labs ordered are listed, but only abnormal results are displayed) Labs Reviewed  RESP PANEL BY RT-PCR (FLU A&B, COVID) [id number]    EKG EKG Interpretation  Date/Time:  Wednesday October 13 2021 17:02:43 EDT Ventricular Rate:  65 PR Interval:  158 QRS Duration: 95 QT Interval:  487 QTC Calculation: 507 R Axis:   79 Text Interpretation: Sinus rhythm Nonspecific T abnrm, anterolateral leads Prolonged QT interval ECG OTHERWISE WITHIN NORMAL LIMITS Confirmed by Noemi Chapel [phone]) on 10/13/2021 5:33:49 PM  Radiology No results found.  Procedures Procedures   Medications Ordered in ED Medications  prochlorperazine (COMPAZINE) tablet 10 mg (10 mg Oral Given 10/13/21 1557)  diphenhydrAMINE (BENADRYL) capsule 25 mg (25 mg Oral Given 10/13/21 1557)  ondansetron (ZOFRAN-ODT) disintegrating tablet 8 mg (8 mg Oral Given 10/13/21 1555)  cyclobenzaprine (FLEXERIL)  tablet 10 mg (10 mg Oral Given 10/13/21 1703)    ED Course  I have reviewed the triage vital signs and the nursing notes.  Pertinent labs & imaging results that were available during my care of the patient were reviewed by me and considered in my medical decision making (see chart for details).    MDM Rules/Calculators/A&P                          Initial impression-patient presents with a headache nausea and vomiting.  She is alert, does not appear acute stress, vital signs reassuring, history consistent with tension headache/migraine will provide migraine cocktail and reassess.  Work-up-EKG sinus without signs of ischemia, respiratory panel negative.  Reassessment- nursing staff notified me patient is refusing IV at this time, she requests p.o. medication instead, will change orders.  Patient  reassessed, states she is feeling better, headache has improved, she is not nauseous at this time, tolerating p.o., she does endorse she now has generalized body aches, and feels as if her breathing was shallow, thinks it is from the nasal congestion, she has had a recent sick contact, she is vaccinated to COVID-19, likely she is suffering from a viral URI, will add on respiratory panel, obtain EKG and reassess.  Patient reassessed, has no complaints this time, patient agreed for discharge.  Rule out-I have low suspicion for intracranial head bleed or CVA she has no focal deficits present on exam, she denies recent head trauma, is not on anticoagulant, presentation is atypical of etiology.  CT imaging was deferred as she states this feels like her typical headaches, improvement after migraine cocktail,.  No new features.  Low suspicion for dissection of the vertebral/carotid arteries as patient has low risk factors, presentation atypical.  Low suspicion for meningitis as she has no meningeal sign, she  is nontoxic-appearing, no history of IV drug use.  I have low suspicion for ACS as patient has chest  pain, no shortness of breath, EKG sinus without signs of ischemia.  She does endorse some shallow breathing but she also endorses general body aches nasal congestion had a recent sick contact this is likely secondary due to a viral process.  Low suspicion for PE as vital signs are reassuring, nontachypneic, nonhypoxic, no unilateral edema present.  I have low suspicion for pneumonia as there is no rhonchi rales heard my exam, imaging will be deferred as is atypical to develop pneumonia on first day of onset.  Plan-  Headache, general body aches-likely patient  has a viral process, will recommend that she continue with over-the-counter pain medications, will provide her with antiemetics, Flexeril for general body aches follow-up with PCP as needed.  Given strict return precautions.  Vital signs have remained stable, no indication for hospital admission.   Patient given at home care as well strict return precautions.  Patient verbalized that they understood agreed to said plan.  Final Clinical Impression(s) / ED Diagnoses Final diagnoses:  Bad headache  Body aches    Rx / DC Orders ED Discharge Orders          Ordered    cyclobenzaprine (FLEXERIL) 10 MG tablet  2 times daily PRN        10/13/21 1829    ondansetron (ZOFRAN) 4 MG tablet  Every 8 hours PRN        10/13/21 1829             Marcello Fennel, PA-C 10/13/21 1831    Noemi Chapel, MD 10/14/21 2328

## 2021-10-13 NOTE — Discharge Instructions (Addendum)
Exam and lab work are all reassuring, your COVID test and flu test were negative.  Please continue with over-the-counter pain medications ibuprofen and Tylenol as needed for fever and pain control.  Also given you Flexeril please use as needed for muscle spasms.  Zofran for nausea.  Please stay hydrated and drink plenty fluids.  Follow-up with your PCP as needed.  Come back to the emergency department if you develop chest pain, shortness of breath, severe abdominal pain, uncontrolled nausea, vomiting, diarrhea.

## 2021-10-13 NOTE — ED Notes (Addendum)
Pt. Refusing to have an IV placed. EMS attempted and this nurse attempted x1. Pt. Is requesting oral medications instead.

## 2021-10-13 NOTE — ED Notes (Signed)
Encouraged pt. To drink their water as tolerated. Pt. Keeps complaining of leg cramps. Notified PA.

## 2021-10-13 NOTE — ED Triage Notes (Signed)
Pt. Arrived via [facility] with complaints of feeling suddenly weak and having a headache during a meeting. Pt. Had x1 emesis at their work. Pt. Has had one more episode of emesis coming into the room. Pt. Has a history of migranes. Pt. States they have been feeling weak for x 2 days.

## 2022-03-03 IMAGING — MR MR ANKLE*L* W/O CM
5 series · 40 of 40 positions shown · non-contrast
Comparison: None.

CLINICAL DATA: Left ankle pain along the lateral aspect for 4
months. No known injury.

EXAM:
MRI OF THE LEFT ANKLE WITHOUT CONTRAST
TECHNIQUE: Multiplanar, multisequence MR imaging of the ankle was performed. No
intravenous contrast was administered.

[Series 4: T1 · sagittal · 4.0mm · 0.56mm/px · 6 of 20 slices shown]
[im 1/20]
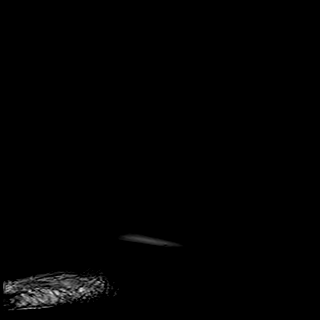
[im 4/20]
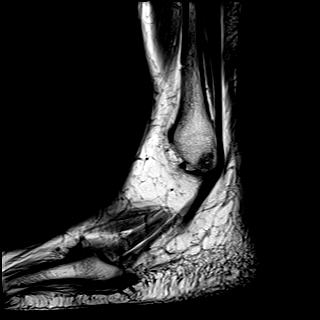
[im 8/20]
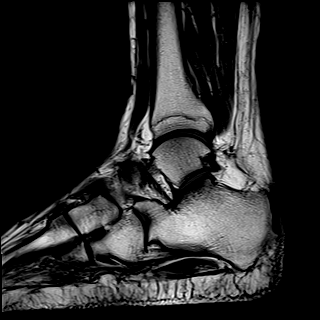
[im 12/20]
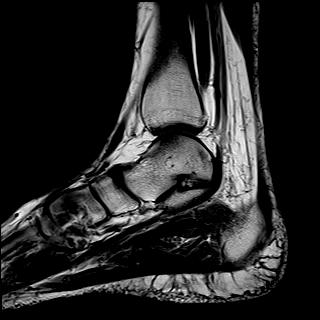
[im 16/20]
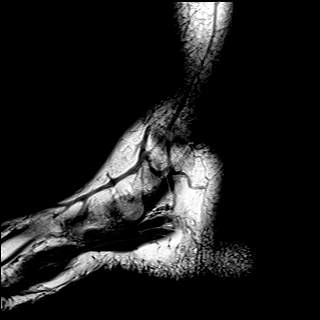
[im 20/20]
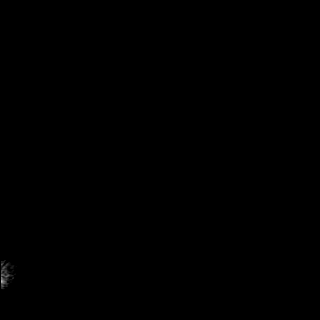

[Series 5: T2 fat-sat · axial · 3.0mm · 0.50mm/px · z∈[-65,+63]mm · 9 of 34 slices shown (1 of 2)]
[im 1/34]
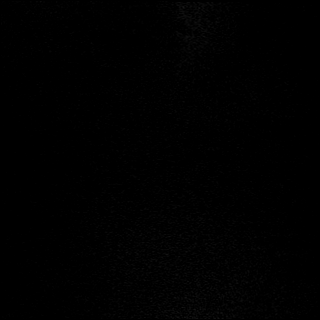
[im 5/34]
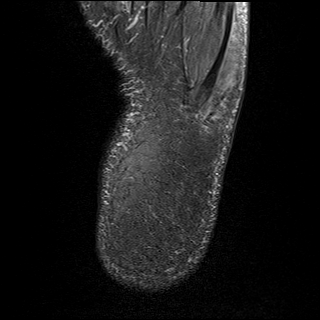
[im 9/34]
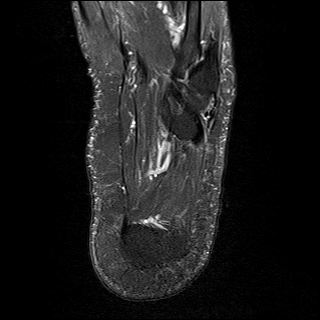
[im 13/34]
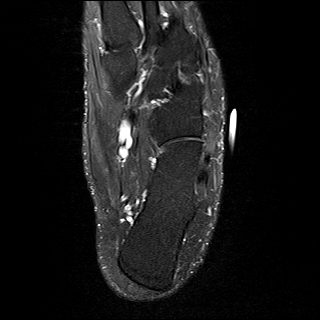
[im 17/34]
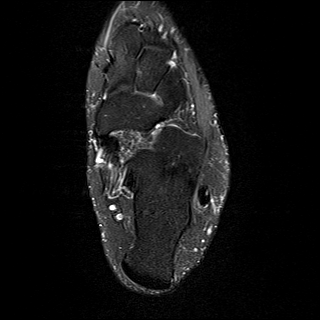
[im 21/34]
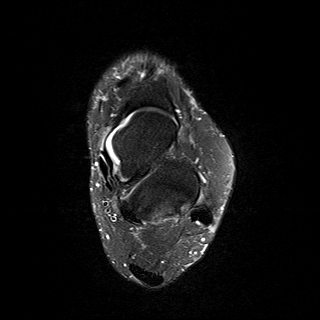
[im 25/34]
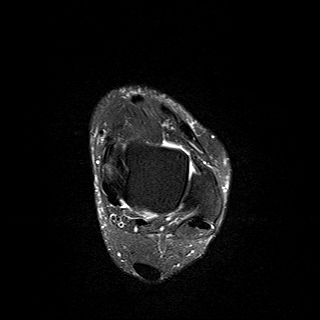
[im 29/34]
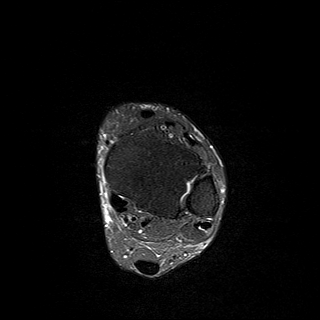
[im 34/34]
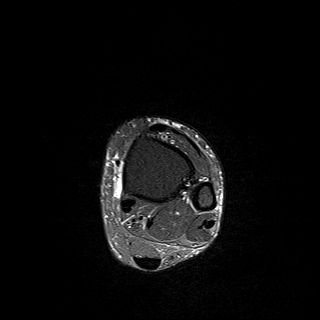

[Series 6: STIR · sagittal · 4.0mm · 0.35mm/px · 5 of 20 slices shown]
[im 1/20]
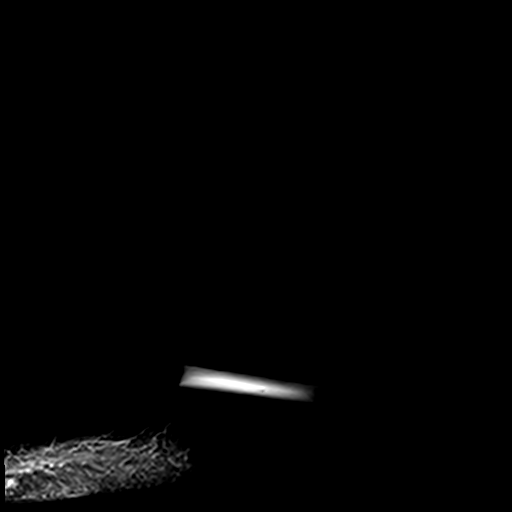
[im 5/20]
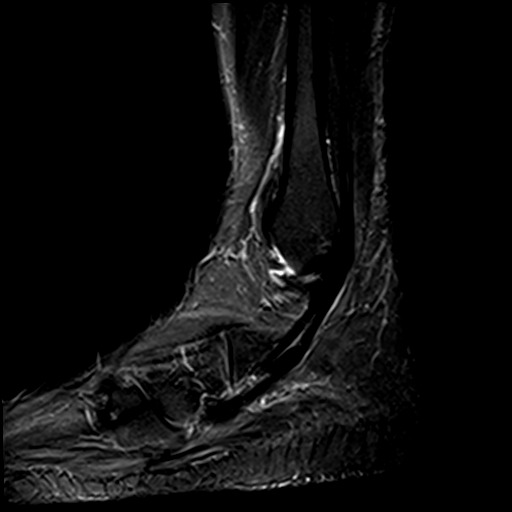
[im 10/20]
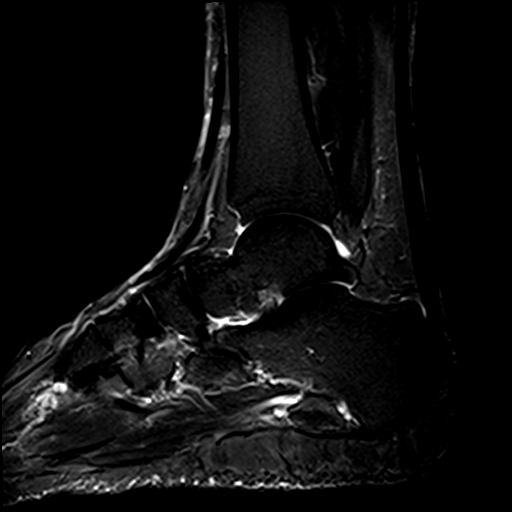
[im 15/20]
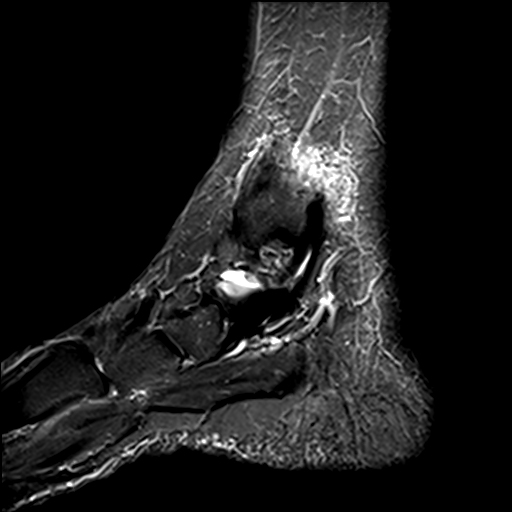
[im 20/20]
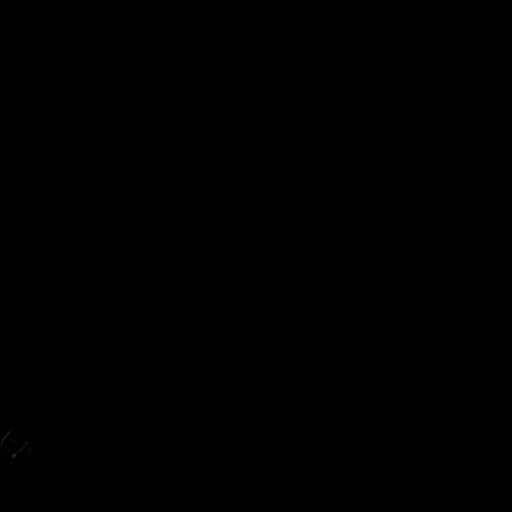

[Series 7: PD fat-sat · axial · 3.0mm · 0.50mm/px · z∈[-65,+63]mm · 9 of 34 slices shown]
[im 1/34]
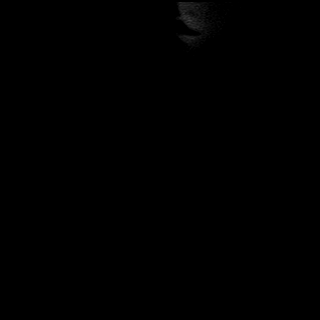
[im 5/34]
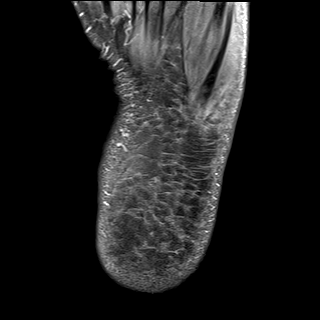
[im 9/34]
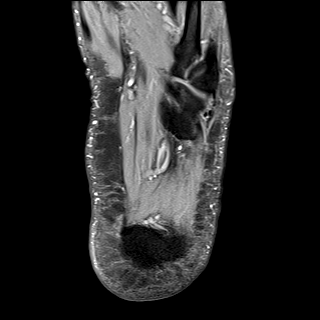
[im 13/34]
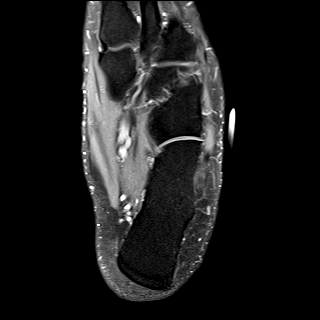
[im 17/34]
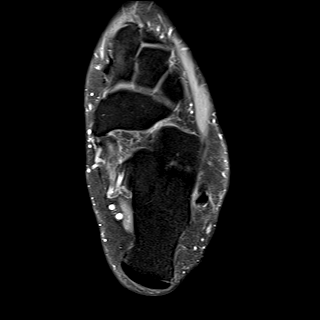
[im 21/34]
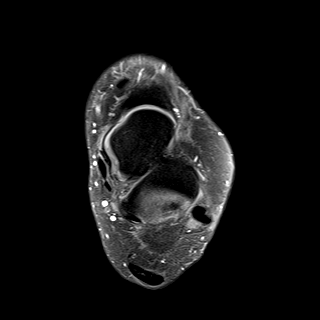
[im 25/34]
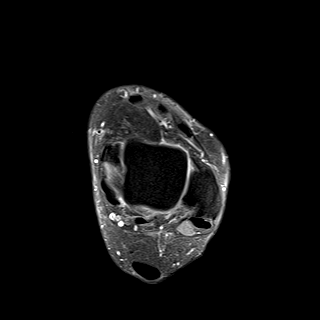
[im 29/34]
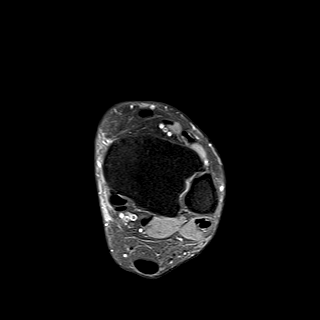
[im 34/34]
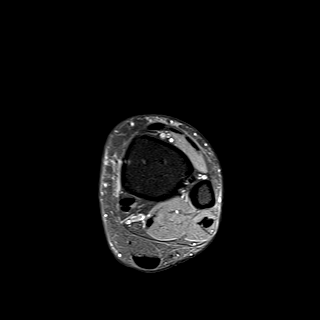

[Series 8: T2 fat-sat · coronal · 3.0mm · 0.50mm/px · 11 of 39 slices shown (2 of 2)]
[im 1/39]
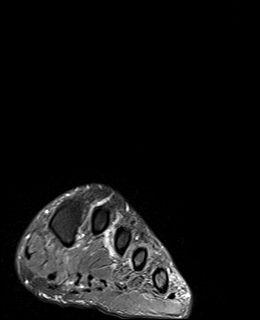
[im 4/39]
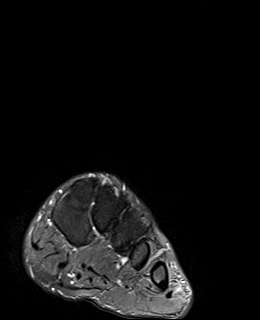
[im 8/39]
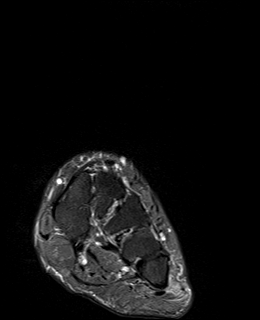
[im 12/39]
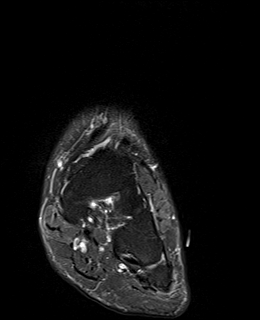
[im 16/39]
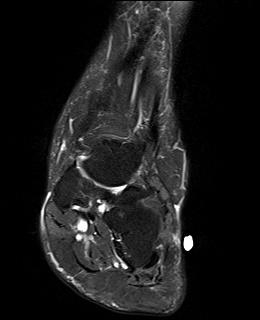
[im 20/39]
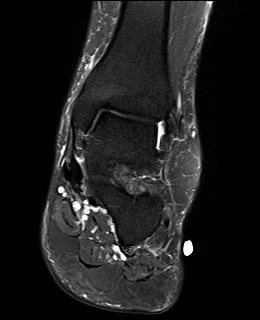
[im 23/39]
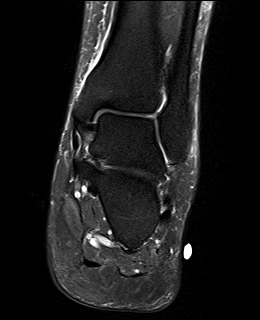
[im 27/39]
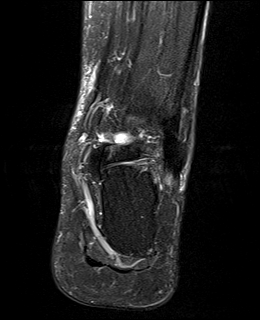
[im 31/39]
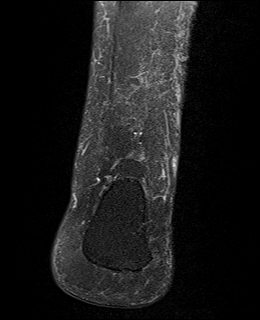
[im 35/39]
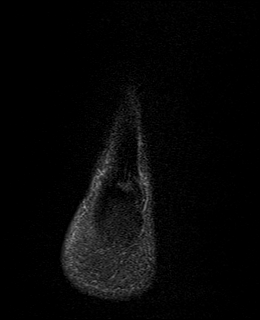
[im 39/39]
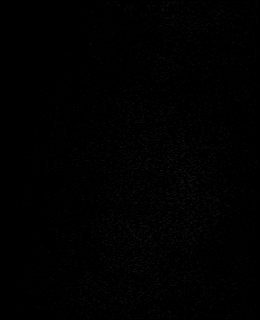

[40 of 40 positions shown; findings below may reference images not displayed]

FINDINGS: TENDONS

Peroneal: Mild tendinosis of the peroneus longus without a tear.
Peroneal brevis intact.

Posteromedial: Posterior tibial tendon intact. Flexor hallucis
longus tendon intact. Flexor digitorum longus tendon intact.

Anterior: Tibialis anterior tendon intact. Extensor hallucis longus
tendon intact Extensor digitorum longus tendon intact.

Achilles:  Intact.

Plantar Fascia: Intact.

LIGAMENTS

Lateral: Anterior talofibular ligament intact. Calcaneofibular
ligament intact. Posterior talofibular ligament intact. Anterior and
posterior tibiofibular ligaments intact.

Medial: Deltoid ligament intact. Spring ligament intact.

CARTILAGE

Ankle Joint: No joint effusion. Normal ankle mortise. No chondral
defect.

Subtalar Joints/Sinus Tarsi: Normal subtalar joints. No subtalar
joint effusion. Normal sinus tarsi.

Bones: No marrow signal abnormality.  No fracture or dislocation.

Soft Tissue: No fluid collection or hematoma. Muscles are normal
without edema or atrophy. Tarsal tunnel is normal.
IMPRESSION: 1. Mild tendinosis of the peroneus longus without a tear.
2.  No acute osseous injury of the left ankle.

## 2022-04-13 ENCOUNTER — Telehealth: Payer: Self-pay | Admitting: *Deleted

## 2022-04-13 NOTE — Telephone Encounter (Addendum)
Patient is calling to request a letter to give to cancel [employer] membership due her foot issues, is unable to work /exercise. Please advise. ?

## 2022-04-14 NOTE — Telephone Encounter (Signed)
That is fine to write letter

## 2022-04-15 NOTE — Telephone Encounter (Signed)
Please call patient when letter is ready for pick up.

## 2022-04-18 ENCOUNTER — Encounter: Payer: Self-pay | Admitting: Podiatry

## 2023-04-10 ENCOUNTER — Other Ambulatory Visit: Payer: Self-pay | Admitting: Sports Medicine

## 2023-04-10 DIAGNOSIS — M25561 Pain in right knee: Secondary | ICD-10-CM

## 2023-05-04 ENCOUNTER — Ambulatory Visit
Admission: RE | Admit: 2023-05-04 | Discharge: 2023-05-04 | Disposition: A | Discharge status: Home / Self Care | Payer: BC Managed Care – PPO | Source: Ambulatory Visit | Attending: Sports Medicine | Admitting: Sports Medicine

## 2023-05-04 DIAGNOSIS — M25561 Pain in right knee: Secondary | ICD-10-CM

## 2024-09-04 ENCOUNTER — Telehealth: Payer: Self-pay | Admitting: Podiatry

## 2024-09-04 NOTE — Telephone Encounter (Signed)
 Called patient to confirm appointment cancellation- voicemail not set up

## 2024-09-06 ENCOUNTER — Ambulatory Visit: Admitting: Podiatry
